# Patient Record
Sex: Male | Born: 2000
Health system: Southern US, Community
[De-identification: ages and names within clinical notes are randomized; demographics above are authoritative.]

## PROBLEM LIST (undated history)

## (undated) DIAGNOSIS — J45909 Unspecified asthma, uncomplicated: Secondary | ICD-10-CM

---
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
## 2021-05-06 DIAGNOSIS — Z23 Encounter for immunization: Secondary | ICD-10-CM | POA: Diagnosis not present

## 2021-07-23 DIAGNOSIS — S93402A Sprain of unspecified ligament of left ankle, initial encounter: Secondary | ICD-10-CM | POA: Diagnosis not present

## 2022-02-03 ENCOUNTER — Other Ambulatory Visit (HOSPITAL_BASED_OUTPATIENT_CLINIC_OR_DEPARTMENT_OTHER): Payer: Self-pay

## 2022-02-03 ENCOUNTER — Encounter (HOSPITAL_BASED_OUTPATIENT_CLINIC_OR_DEPARTMENT_OTHER): Payer: Self-pay | Admitting: Urology

## 2022-02-03 ENCOUNTER — Emergency Department (HOSPITAL_BASED_OUTPATIENT_CLINIC_OR_DEPARTMENT_OTHER): Payer: Federal, State, Local not specified - PPO

## 2022-02-03 ENCOUNTER — Emergency Department (HOSPITAL_BASED_OUTPATIENT_CLINIC_OR_DEPARTMENT_OTHER)
Admission: EM | Admit: 2022-02-03 | Discharge: 2022-02-03 | Disposition: A | Discharge status: Home / Self Care | Payer: Federal, State, Local not specified - PPO | Attending: Emergency Medicine | Admitting: Emergency Medicine

## 2022-02-03 DIAGNOSIS — J45909 Unspecified asthma, uncomplicated: Secondary | ICD-10-CM | POA: Diagnosis not present

## 2022-02-03 DIAGNOSIS — Z7951 Long term (current) use of inhaled steroids: Secondary | ICD-10-CM | POA: Diagnosis not present

## 2022-02-03 DIAGNOSIS — R0602 Shortness of breath: Secondary | ICD-10-CM | POA: Diagnosis not present

## 2022-02-03 DIAGNOSIS — R0789 Other chest pain: Secondary | ICD-10-CM | POA: Diagnosis not present

## 2022-02-03 DIAGNOSIS — M940 Chondrocostal junction syndrome [Tietze]: Secondary | ICD-10-CM | POA: Insufficient documentation

## 2022-02-03 DIAGNOSIS — R079 Chest pain, unspecified: Secondary | ICD-10-CM | POA: Diagnosis not present

## 2022-02-03 DIAGNOSIS — R062 Wheezing: Secondary | ICD-10-CM | POA: Diagnosis not present

## 2022-02-03 LAB — BASIC METABOLIC PANEL
Anion gap: 6 (ref 5–15)
BUN: 15 mg/dL (ref 6–20)
CO2: 26 mmol/L (ref 22–32)
Calcium: 8.8 mg/dL — ABNORMAL LOW (ref 8.9–10.3)
Chloride: 105 mmol/L (ref 98–111)
Creatinine, Ser: 0.99 mg/dL (ref 0.61–1.24)
GFR, Estimated: >60 mL/min (ref >60)
Glucose, Bld: 105 mg/dL — ABNORMAL HIGH (ref 70–99)
Potassium: 3.4 mmol/L — ABNORMAL LOW (ref 3.5–5.1)
Sodium: 137 mmol/L (ref 135–145)

## 2022-02-03 LAB — CBC WITH DIFFERENTIAL/PLATELET
Abs Immature Granulocytes: 0.02 10*3/uL (ref 0.00–0.07)
Basophils Absolute: 0.1 10*3/uL (ref 0.0–0.1)
Basophils Relative: 1 %
Eosinophils Absolute: 0.1 10*3/uL (ref 0.0–0.5)
Eosinophils Relative: 1 %
HCT: 48.3 % (ref 39.0–52.0)
Hemoglobin: 16.8 g/dL (ref 13.0–17.0)
Immature Granulocytes: 0 %
Lymphocytes Relative: 44 %
Lymphs Abs: 3.1 10*3/uL (ref 0.7–4.0)
MCH: 31.8 pg (ref 26.0–34.0)
MCHC: 34.8 g/dL (ref 30.0–36.0)
MCV: 91.3 fL (ref 80.0–100.0)
Monocytes Absolute: 0.5 10*3/uL (ref 0.1–1.0)
Monocytes Relative: 7 %
Neutro Abs: 3.3 10*3/uL (ref 1.7–7.7)
Neutrophils Relative %: 47 %
Platelets: 169 10*3/uL (ref 150–400)
RBC: 5.29 MIL/uL (ref 4.22–5.81)
RDW: 11.6 % (ref 11.5–15.5)
WBC: 7 10*3/uL (ref 4.0–10.5)
nRBC: 0 % (ref 0.0–0.2)

## 2022-02-03 LAB — TROPONIN I (HIGH SENSITIVITY): Troponin I (High Sensitivity): <2 ng/L (ref <18)

## 2022-02-03 MED ORDER — ALBUTEROL SULFATE HFA 108 (90 BASE) MCG/ACT IN AERS
1.0000 | INHALATION_SPRAY | Freq: Four times a day (QID) | RESPIRATORY_TRACT | 0 refills | Status: AC | PRN
  Filled 2022-02-03: qty 6.7, 25d supply, fill #0

## 2022-02-03 MED ORDER — IPRATROPIUM-ALBUTEROL 0.5-2.5 (3) MG/3ML IN SOLN
3.0000 mL | Freq: Once | RESPIRATORY_TRACT | Status: AC
  Administered 2022-02-03: 3 mL via RESPIRATORY_TRACT
  Filled 2022-02-03: qty 3

## 2022-02-03 NOTE — ED Notes (Signed)
Respiratory Nicky Notified of pt arrival

## 2022-02-03 NOTE — Discharge Instructions (Addendum)
Your labs today were all very reassuring.  Your heart appears to be fine.  I refilled your albuterol inhaler should you feel short of breath again.  As we discussed, the chest pain that you are experiencing is most consistent with costochondritis.  I attached some information here for you to read, but typically this will resolve after a couple of weeks with NSAID therapy.

## 2022-02-03 NOTE — ED Provider Notes (Signed)
[facility] EMERGENCY DEPARTMENT Provider Note   CSN: [id number] Arrival date & time: 02/03/22  1311     History  Chief Complaint  Patient presents with   Shortness of Breath    Jason Wright is a 21 y.o. male with history of asthma presents to the emergency department for evaluation of chest pain and shortness of breath that started approximately 30 minutes prior to arrival.  Patient states symptoms started while he was at rest and it felt like a dull aching pain center of his chest.  He also felt quite short of breath and he used his albuterol inhaler with not significant improvement.  Since then, symptoms have improved although reports that they are coming and going in waves.  He denies fever, chills, abdominal pain, nausea, vomiting, diarrhea, leg swelling.  No previous history of cardiac disease, no immediate family history of cardiac disease under the age of 97.  Denies recent heavy lifting or bench pressing.   Shortness of Breath Associated symptoms: chest pain   Associated symptoms: no abdominal pain, no fever, no headaches and no vomiting        Home Medications Prior to Admission medications   Medication Sig Start Date End Date Taking? Authorizing Provider  albuterol (VENTOLIN HFA) 108 (90 Base) MCG/ACT inhaler Inhale 1-2 puffs into the lungs every 6 (six) hours as needed for wheezing or shortness of breath. 02/03/22  Yes Raynald Blend R, PA-C  albuterol (VENTOLIN HFA) 108 (90 Base) MCG/ACT inhaler 2 puffs as needed    [provider]  budesonide-formoterol (SYMBICORT) 80-4.5 MCG/ACT inhaler 2 puffs    [provider]  fluticasone (FLONASE) 50 MCG/ACT nasal spray Place 2 sprays into both nostrils daily. 09/04/20   Joy, Shawn C, PA-C  fluticasone (FLONASE) 50 MCG/ACT nasal spray PLACE 2 SPRAYS INTO BOTH NOSTRILS DAILY 09/04/20 09/04/21  Joy, Shawn C, PA-C      Allergies    Penicillins    Review of Systems   Review of Systems  Constitutional:   Negative for fever.  Respiratory:  Positive for shortness of breath.   Cardiovascular:  Positive for chest pain. Negative for leg swelling.  Gastrointestinal:  Negative for abdominal pain, diarrhea, nausea and vomiting.  Genitourinary:  Negative for dysuria.  Neurological:  Negative for numbness and headaches.    Physical Exam Updated Vital Signs BP 116/78   Pulse 94   Temp 98.5 F (36.9 C) (Oral)   Resp 16   Ht 5\' 9"  (1.753 m)   Wt 117.5 kg   SpO2 100%   BMI 38.25 kg/m  Physical Exam Vitals and nursing note reviewed.  Constitutional:      General: He is not in acute distress.    Appearance: He is not ill-appearing.  HENT:     Head: Atraumatic.  Eyes:     Conjunctiva/sclera: Conjunctivae normal.  Cardiovascular:     Rate and Rhythm: Normal rate and regular rhythm.     Pulses: Normal pulses.     Heart sounds: No murmur heard. Pulmonary:     Effort: Pulmonary effort is normal. No respiratory distress.     Breath sounds: Wheezing present.     Comments: Mild expiratory wheezing noted in upper lung fields Chest:       Comments: Reproducible sternal chest wall tenderness.  No crepitus, ecchymosis or palpable deformity. Abdominal:     General: Abdomen is flat. There is no distension.     Palpations: Abdomen is soft.     Tenderness:  There is no abdominal tenderness.  Musculoskeletal:        General: Normal range of motion.     Cervical back: Normal range of motion.  Skin:    General: Skin is warm and dry.     Capillary Refill: Capillary refill takes less than 2 seconds.  Neurological:     General: No focal deficit present.     Mental Status: He is alert.  Psychiatric:        Mood and Affect: Mood normal.     ED Results / Procedures / Treatments   Labs (all labs ordered are listed, but only abnormal results are displayed) Labs Reviewed  BASIC METABOLIC PANEL - Abnormal; Notable for the following components:      Result Value   Potassium 3.4 (*)    Glucose, Bld  105 (*)    Calcium 8.8 (*)    All other components within normal limits  CBC WITH DIFFERENTIAL/PLATELET  TROPONIN I (HIGH SENSITIVITY)    EKG EKG Interpretation  Date/Time:  Wednesday February 03 2022 13:24:03 EDT Ventricular Rate:  80 PR Interval:  135 QRS Duration: 100 QT Interval:  371 QTC Calculation: 428 R Axis:   54 Text Interpretation: Sinus arrhythmia RSR' in V1 or V2, right VCD or RVH Confirmed by Ernie Avena ([id number]) on 02/03/2022 1:26:45 PM  Radiology DG Chest 2 View  Result Date: 02/03/2022 CLINICAL DATA:  Chest pain, shortness of breath EXAM: CHEST - 2 VIEW COMPARISON:  09/04/2020 FINDINGS: The heart size and mediastinal contours are within normal limits. Both lungs are clear. The visualized skeletal structures are unremarkable. IMPRESSION: No active cardiopulmonary disease. Electronically Signed   By: Ernie Avena M.D.   On: 02/03/2022 14:04    Procedures Procedures    Medications Ordered in ED Medications  ipratropium-albuterol (DUONEB) 0.5-2.5 (3) MG/3ML nebulizer solution 3 mL (3 mLs Nebulization Given 02/03/22 1404)    ED Course/ Medical Decision Making/ A&P                           Medical Decision Making Amount and/or Complexity of Data Reviewed Labs: ordered. Radiology: ordered.  Risk Prescription drug management.   Social determinants of health:  Social History   Socioeconomic History   Marital status: Single    Spouse name: Not on file   Number of children: Not on file   Years of education: Not on file   Highest education level: Not on file  Occupational History   Not on file  Tobacco Use   Smoking status: Never   Smokeless tobacco: Never  Substance and Sexual Activity   Alcohol use: Never   Drug use: Never   Sexual activity: Not on file  Other Topics Concern   Not on file  Social History Narrative   Not on file   Social Determinants of Health   Financial Resource Strain: Not on file  Food Insecurity: Not on file   Transportation Needs: Not on file  Physical Activity: Not on file  Stress: Not on file  Social Connections: Not on file  Intimate Partner Violence: Not on file     Initial impression:  This patient presents to the ED for concern of chest pain or shortness of breath, this involves an extensive number of treatment options, and is a complaint that carries with it a high risk of complications and morbidity.   Differentials include ACS, PE, pneumonia, asthma exacerbation, anxiety, costochondritis.   Comorbidities affecting care:  Asthma  Additional history obtained: Mother  Lab Tests  I Ordered, reviewed, and interpreted labs and EKG.  The pertinent results include:  BMP, CBC and troponin normal  Imaging Studies ordered:  I ordered imaging studies including  Chest x-ray normal I independently visualized and interpreted imaging and I agree with the radiologist interpretation.   EKG: NSR  Cardiac Monitoring:  The patient was maintained on a cardiac monitor.  I personally viewed and interpreted the cardiac monitored which showed an underlying rhythm of: nsr   Medicines ordered and prescription drug management:  I ordered medication including: Duoneb    Reevaluation of the patient after these medicines showed that the patient resolved I have reviewed the patients home medicines and have made adjustments as needed    ED Course/Re-evaluation: Presents in no acute distress and is nontoxic-appearing.  He is tachypneic with respiratory rate of 25 upon arrival although he is satting at 100% on room air.  On exam, he has reproducible sternal chest wall tenderness and lungs notable for expiratory wheezing in the upper lung fields.  Notes that when pressing on sternum, this aggravates the same pain that he was experiencing which brought him to the emergency department.  He was given DuoNeb treatment with great improvement in symptoms..  Suspect that based on age and reproducible nature  of chest pain, this is likely a costochondritis but I obtained a single troponin and basic labs out of abundance of caution.  Troponin was normal and labs are overall reassuring.  Will refill patient's albuterol inhaler should he develop wheezing again.  Advised on treatment for costochondritis to include NSAIDs for a couple of weeks.  Follow-up with PCP as needed.  Return precautions were discussed.  Patient and mother expressed understanding are amenable to plan.  Disposition:  After consideration of the diagnostic results, physical exam, history and the patients response to treatment feel that the patent would benefit from discharge.   Costochondritis Wheezing: Plan and management as described above. Discharged home in good condition.  Final Clinical Impression(s) / ED Diagnoses Final diagnoses:  Costochondritis  Wheezing    Rx / DC Orders ED Discharge Orders          Ordered    albuterol (VENTOLIN HFA) 108 (90 Base) MCG/ACT inhaler  Every 6 hours PRN        02/03/22 1550              Janell Quiet, PA-C 02/03/22 1552    Ernie Avena, MD 02/03/22 2055

## 2022-02-03 NOTE — ED Triage Notes (Signed)
SOB and chest pain that started approx 30 min PTA while at rest  Used inhaler with no relief  H/o asthma

## 2022-03-11 DIAGNOSIS — Z0000 Encounter for general adult medical examination without abnormal findings: Secondary | ICD-10-CM | POA: Diagnosis not present

## 2022-03-11 DIAGNOSIS — K760 Fatty (change of) liver, not elsewhere classified: Secondary | ICD-10-CM | POA: Diagnosis not present

## 2022-03-11 DIAGNOSIS — Z13220 Encounter for screening for lipoid disorders: Secondary | ICD-10-CM | POA: Diagnosis not present

## 2022-03-11 DIAGNOSIS — Z23 Encounter for immunization: Secondary | ICD-10-CM | POA: Diagnosis not present

## 2022-04-30 DIAGNOSIS — K921 Melena: Secondary | ICD-10-CM | POA: Diagnosis not present

## 2022-04-30 DIAGNOSIS — R7989 Other specified abnormal findings of blood chemistry: Secondary | ICD-10-CM | POA: Diagnosis not present

## 2022-04-30 DIAGNOSIS — R197 Diarrhea, unspecified: Secondary | ICD-10-CM | POA: Diagnosis not present

## 2022-04-30 DIAGNOSIS — R945 Abnormal results of liver function studies: Secondary | ICD-10-CM | POA: Diagnosis not present

## 2022-05-04 DIAGNOSIS — Z23 Encounter for immunization: Secondary | ICD-10-CM | POA: Diagnosis not present

## 2022-06-03 DIAGNOSIS — K921 Melena: Secondary | ICD-10-CM | POA: Diagnosis not present

## 2022-06-03 DIAGNOSIS — R1084 Generalized abdominal pain: Secondary | ICD-10-CM | POA: Diagnosis not present

## 2022-06-03 DIAGNOSIS — K2980 Duodenitis without bleeding: Secondary | ICD-10-CM | POA: Diagnosis not present

## 2022-06-03 DIAGNOSIS — R197 Diarrhea, unspecified: Secondary | ICD-10-CM | POA: Diagnosis not present

## 2022-06-03 DIAGNOSIS — K2930 Chronic superficial gastritis without bleeding: Secondary | ICD-10-CM | POA: Diagnosis not present

## 2022-08-02 IMAGING — CT CT ABD-PELV W/ CM
1 of 2 series · 13 of 32 positions shown, 18 images · IV contrast (APPLIED)
Comparison: None.

CLINICAL DATA: Abdominal pain

EXAM:
CT ABDOMEN AND PELVIS WITH CONTRAST
TECHNIQUE: Multidetector CT imaging of the abdomen and pelvis was performed
using the standard protocol following bolus administration of
intravenous contrast.
CONTRAST:  100mL UZKR8K-PRR IOPAMIDOL (UZKR8K-PRR) INJECTION 61%

[Series 3: abd/pelvis w/cm · axial · 0.87mm/px · z∈[-720,-235]mm · 13 of 111 slices shown, 18 images]
[im 7/111  soft-tissue]
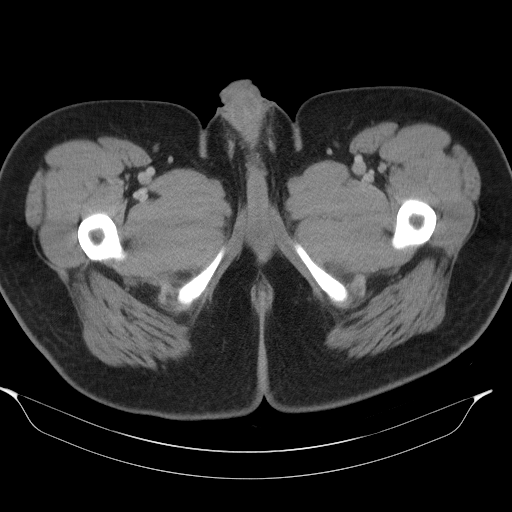
[im 7/111  bone]
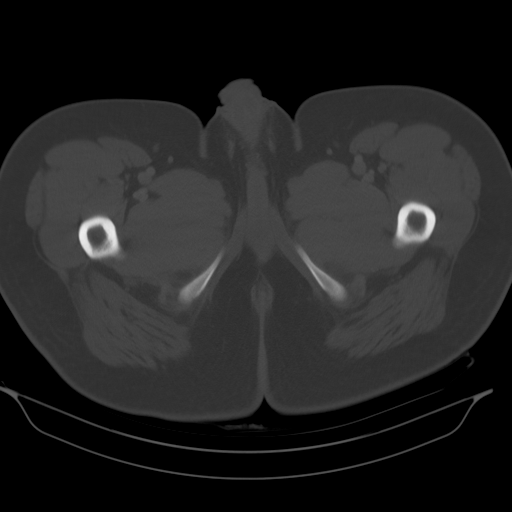
[im 20/111  soft-tissue]
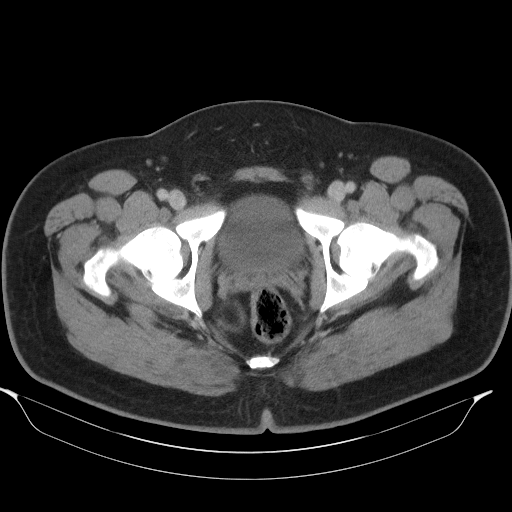
[im 26/111  soft-tissue]
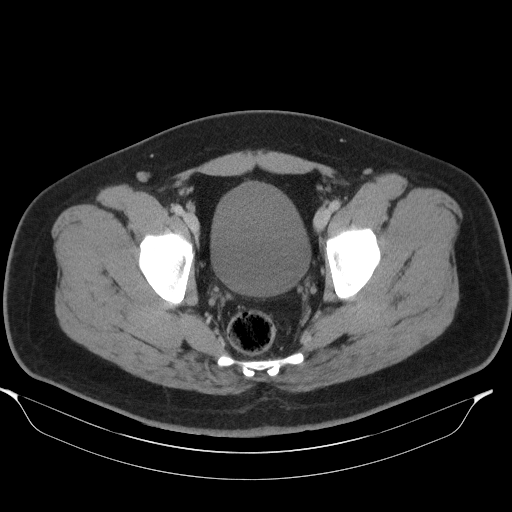
[im 33/111  soft-tissue]
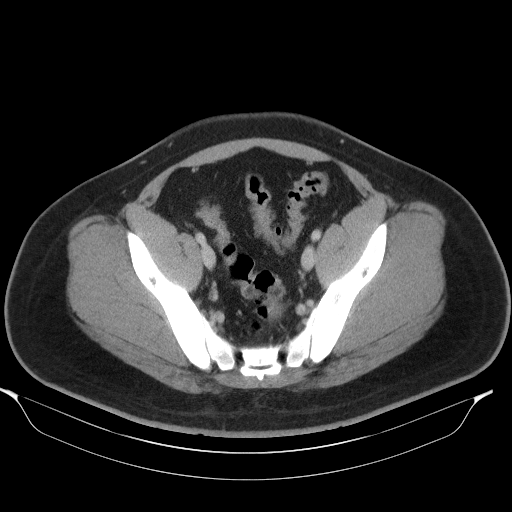
[im 46/111  soft-tissue]
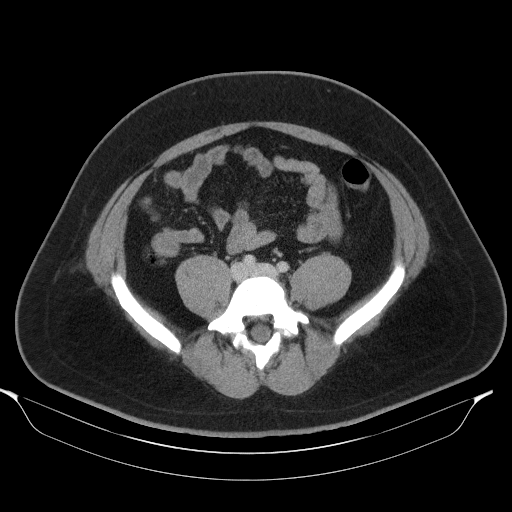
[im 52/111  soft-tissue]
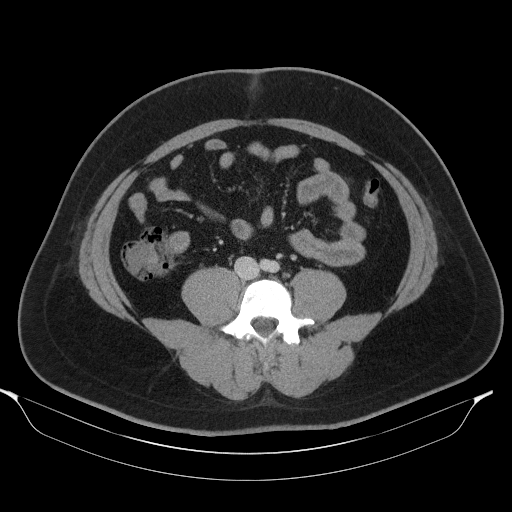
[im 59/111  soft-tissue]
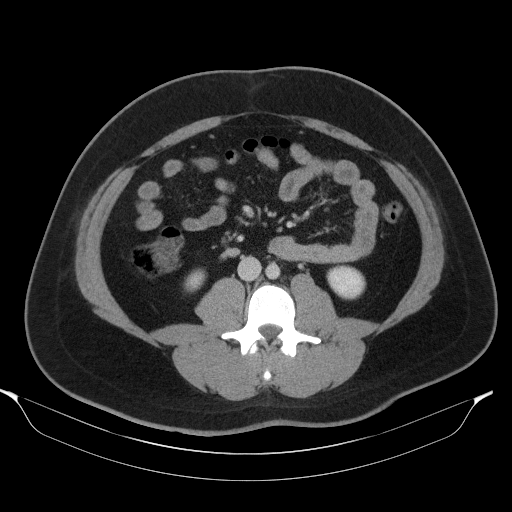
[im 72/111  soft-tissue]
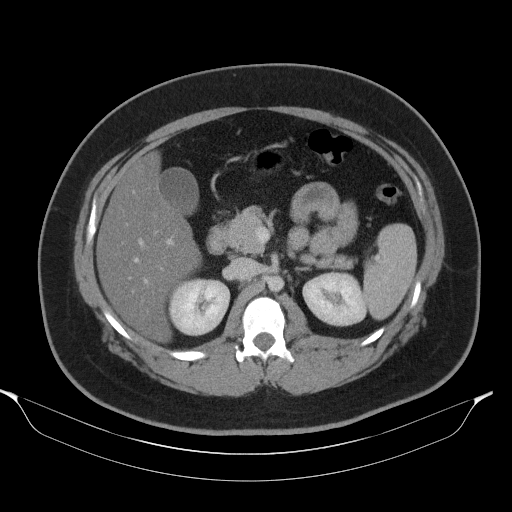
[im 78/111  soft-tissue]
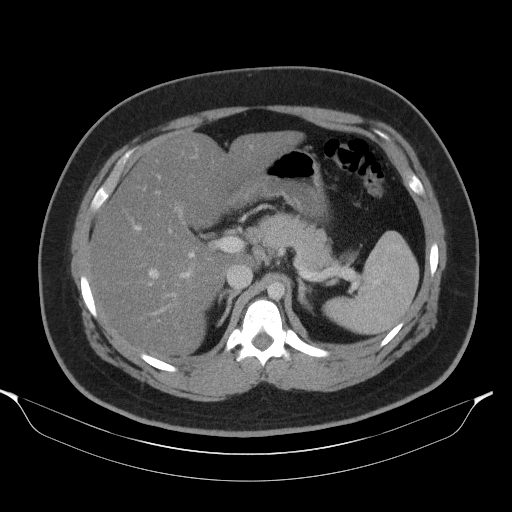
[im 78/111  bone]
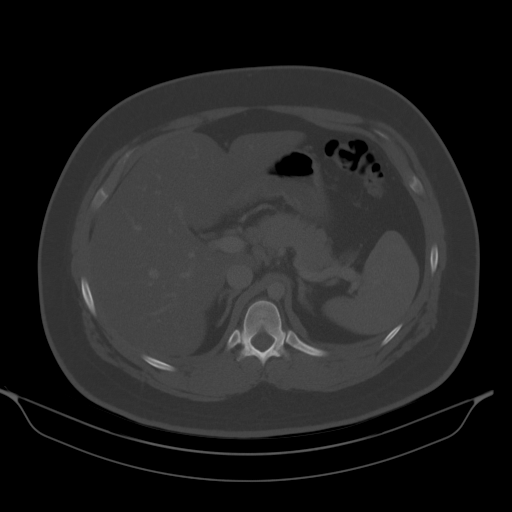
[im 85/111  soft-tissue]
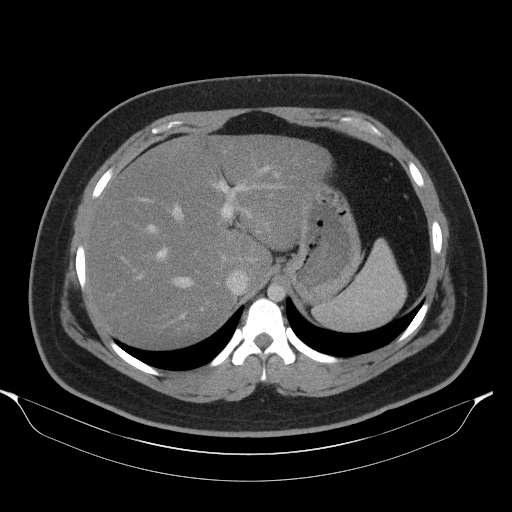
[im 85/111  lung]
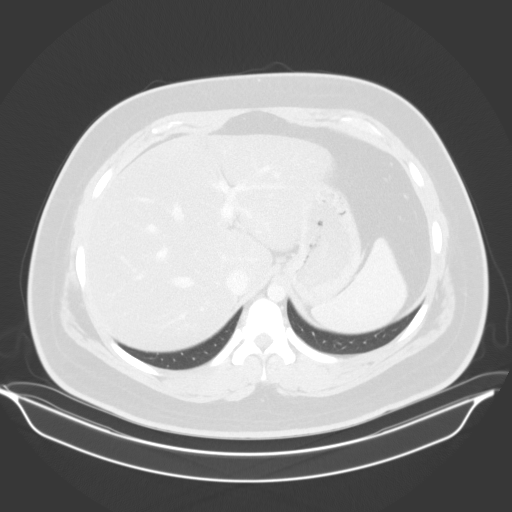
[im 91/111  lung]
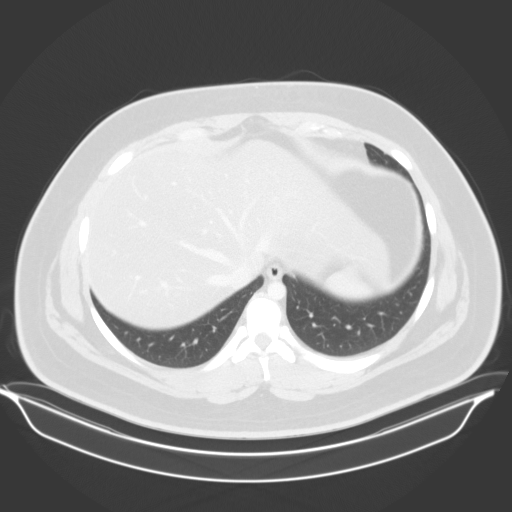
[im 98/111  soft-tissue]
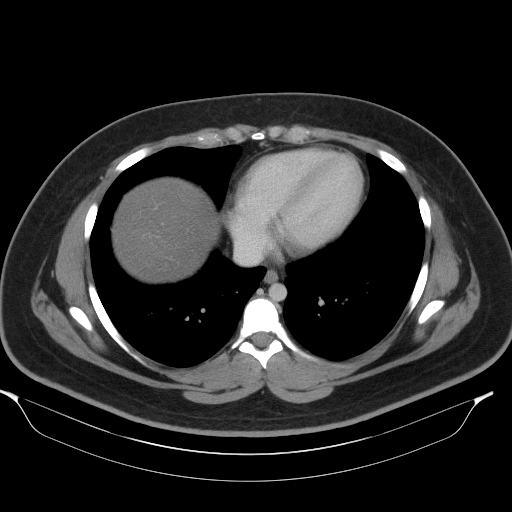
[im 98/111  lung]
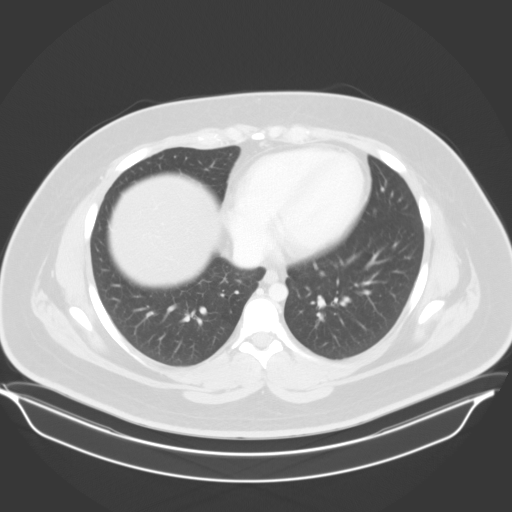
[im 104/111  soft-tissue]
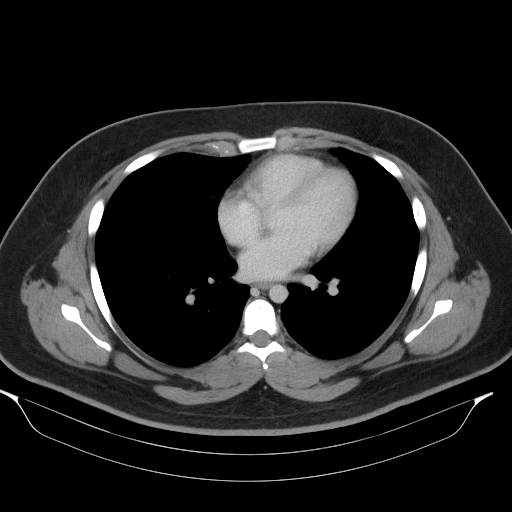
[im 104/111  lung]
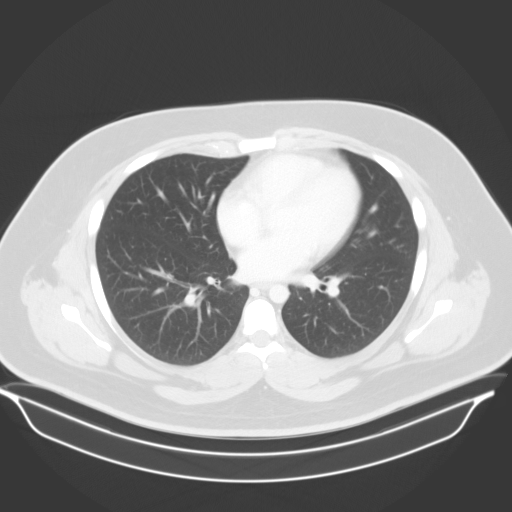

[13 of 32 positions shown; findings below may reference images not displayed]

FINDINGS: Lower chest: Lung bases are clear.

Hepatobiliary: There is diffuse decreased attenuation in the liver
consistent with hepatic steatosis. No focal liver lesions are
evident. Gallbladder wall is not appreciably thickened. There is no
biliary duct dilatation.

Pancreas: No pancreatic mass or inflammatory focus.

Spleen: No splenic lesions are evident.

Adrenals/Urinary Tract: Adrenals bilaterally appear normal. Kidneys
bilaterally show no evident mass or hydronephrosis on either side.
There is no apparent renal or ureteral calculus on either side.
Urinary bladder is midline with wall thickness within normal limits.

Stomach/Bowel: There is no appreciable bowel wall or mesenteric
thickening. The terminal ileum appears normal. There is no
demonstrable bowel obstruction. There is no free air or portal
venous air.

Vascular/Lymphatic: There is no abdominal aortic aneurysm. No
arterial vascular lesions are evident. Major venous structures
appear patent. There is no evident adenopathy in the abdomen or
pelvis by size criteria. There are subcentimeter inguinal and
retroperitoneal lymph nodes which do not meet size criteria for
pathologic significance and are considered nonspecific. Several
subcentimeter mesenteric lymph nodes are noted primarily in the
right abdomen.

Reproductive: Prostate and seminal vesicles appear normal in size
and contour.

Other: Appendix appears normal. No evident abscess or ascites in the
abdomen or pelvis. There is fat in the umbilicus region.

Musculoskeletal: There are no blastic or lytic bone lesions. There
are pars defects at L5 bilaterally with 4 mm of anterolisthesis of
L5 on S1. No intramuscular lesions are evident.
IMPRESSION: 1. There are subcentimeter mesenteric lymph nodes, primarily in the
right lower abdomen region. In the appropriate clinical setting,
lymph nodes of this nature could be indicative of a degree of
mesenteric adenitis. There is no adenopathy by size criteria evident
in the abdomen or pelvis.

2.  Hepatic steatosis.

3. No bowel wall thickening or bowel obstruction. No abscess in the
abdomen or pelvis. Appendix appears normal.

4. No renal or ureteral calculus. No hydronephrosis. Urinary bladder
wall thickness normal.

5. Pars defects at L5 bilaterally with mild spondylolisthesis of L5
on S1.

## 2022-09-25 DIAGNOSIS — R5383 Other fatigue: Secondary | ICD-10-CM | POA: Diagnosis not present

## 2022-09-25 DIAGNOSIS — J029 Acute pharyngitis, unspecified: Secondary | ICD-10-CM | POA: Diagnosis not present

## 2022-09-25 DIAGNOSIS — R110 Nausea: Secondary | ICD-10-CM | POA: Diagnosis not present

## 2022-09-25 DIAGNOSIS — R6883 Chills (without fever): Secondary | ICD-10-CM | POA: Diagnosis not present

## 2022-12-22 IMAGING — CR DG CHEST 2V
2 series · 2 of 2 positions shown · non-contrast
Comparison: None.

CLINICAL DATA: History of pneumonia 2 weeks prior, residual cough
and shortness of breath

EXAM:
CHEST - 2 VIEW

[w chest pa]
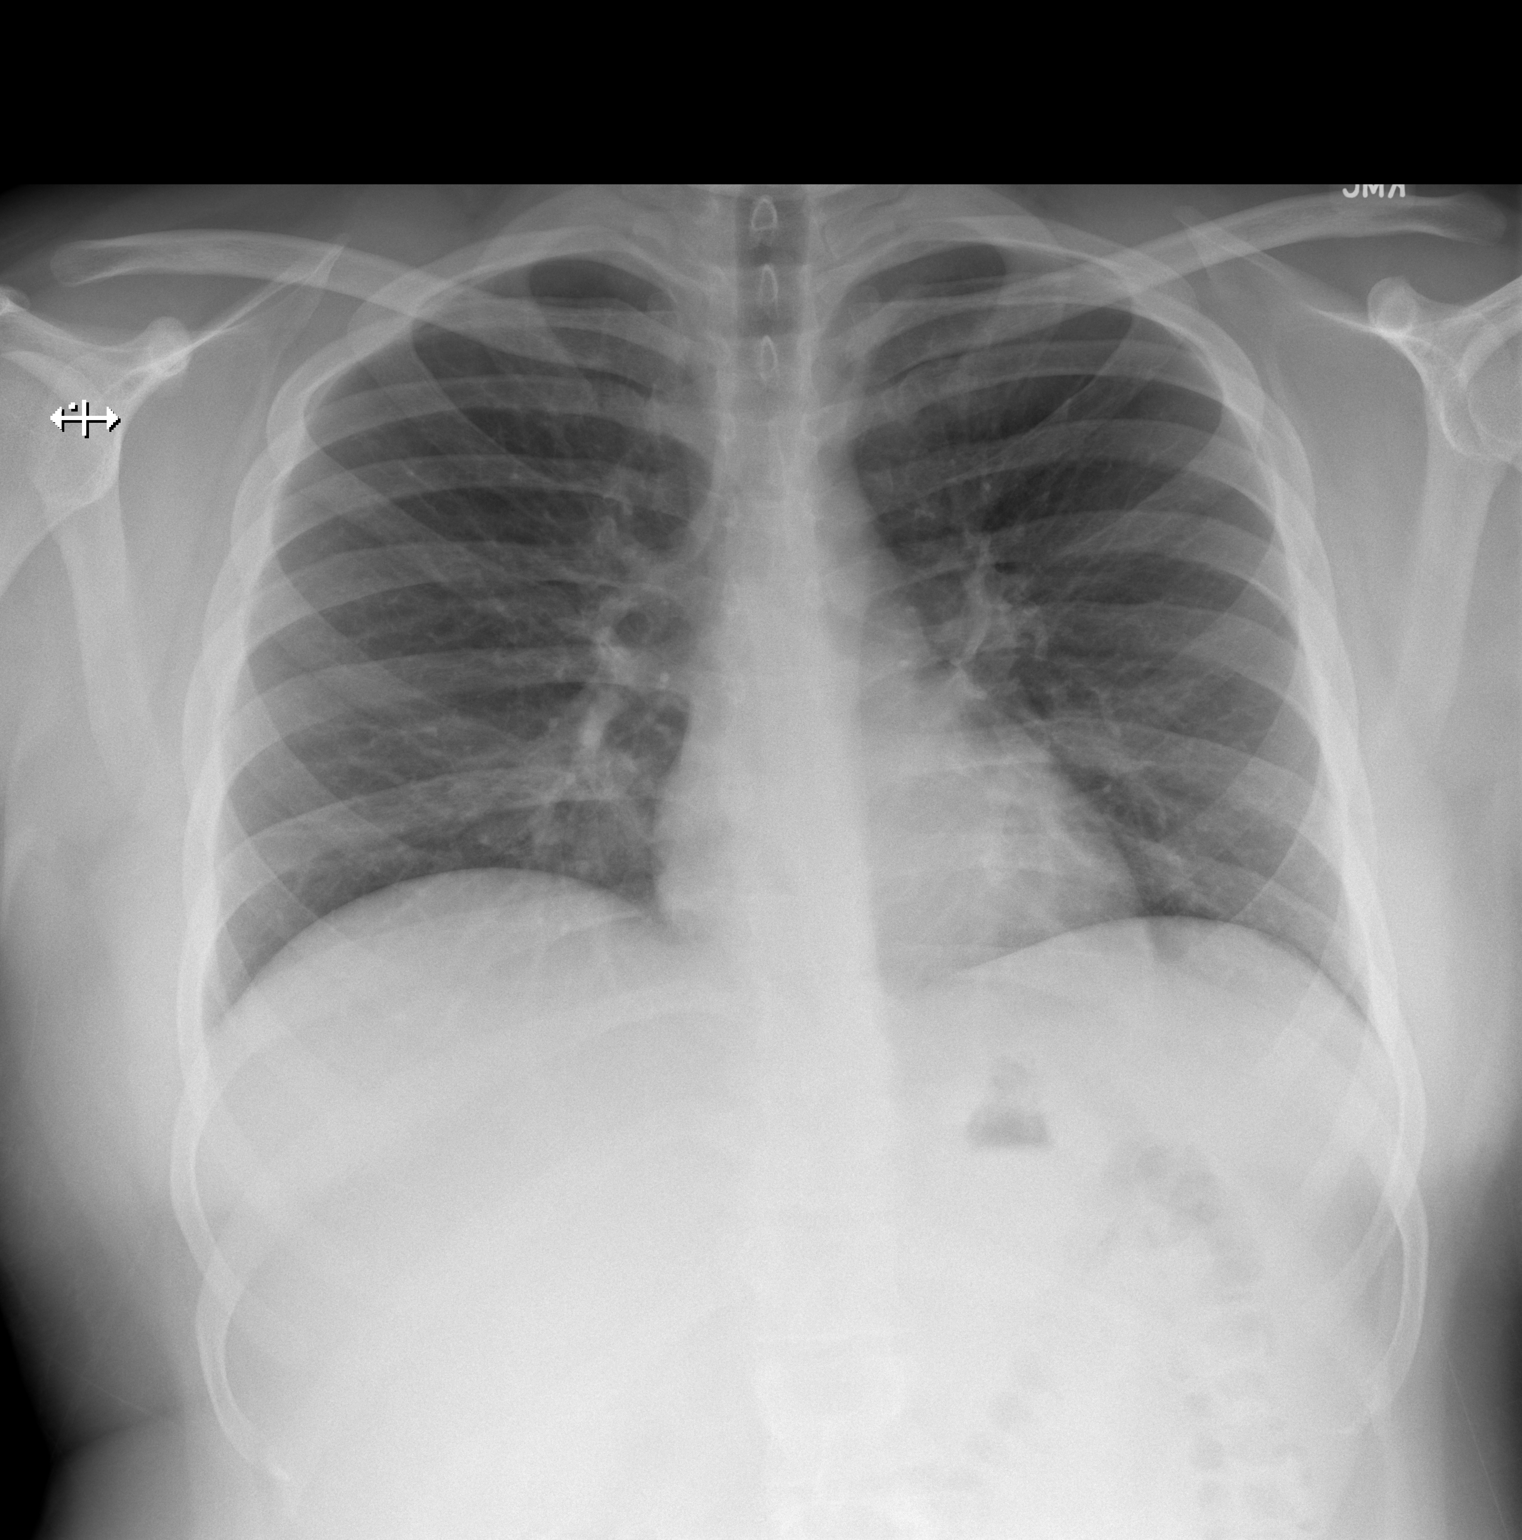

[w chest lat]
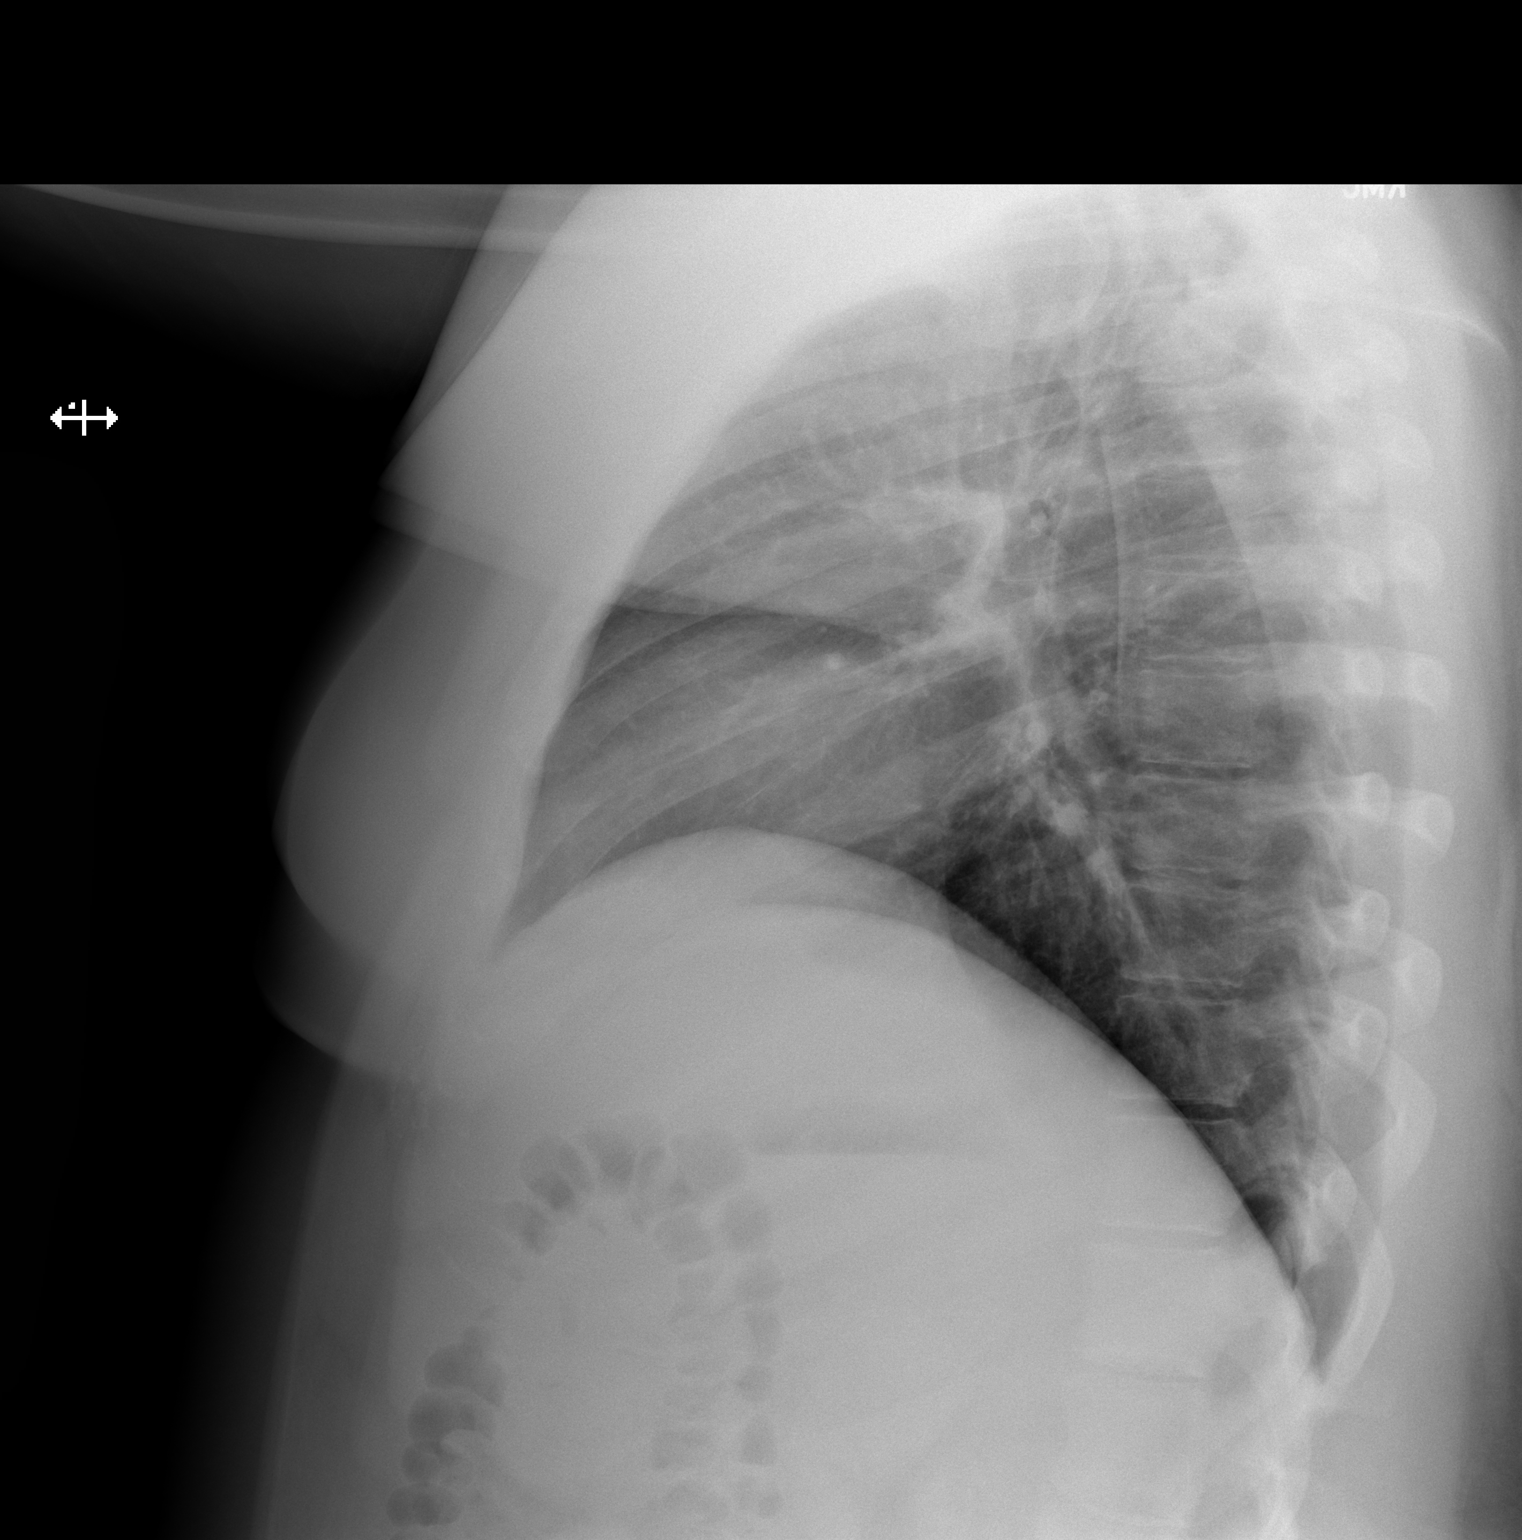

[2 of 2 positions shown; findings below may reference images not displayed]

FINDINGS: Some mild residual airways thickening and faint peribronchial
opacity noted in the infrahilar lungs. Could reflect some residual
infection/airways inflammation. No pneumothorax, effusion or other
significant complication is seen. Convincing features of edema. The
cardiomediastinal contours are unremarkable. No acute osseous or
soft tissue abnormality.
IMPRESSION: Some mild residual airways thickening and faint peribronchial
opacity in the infrahilar lungs, may reflect some residual
infection/airways inflammation.

## 2023-06-13 DIAGNOSIS — Z23 Encounter for immunization: Secondary | ICD-10-CM | POA: Diagnosis not present

## 2023-12-09 ENCOUNTER — Ambulatory Visit
Admission: RE | Admit: 2023-12-09 | Discharge: 2023-12-09 | Disposition: A | Discharge status: Home / Self Care | Source: Ambulatory Visit | Attending: Emergency Medicine | Admitting: Emergency Medicine

## 2023-12-09 VITALS — BP 127/83 | HR 90 | Temp 97.6°F | Resp 16

## 2023-12-09 DIAGNOSIS — H60391 Other infective otitis externa, right ear: Secondary | ICD-10-CM

## 2023-12-09 DIAGNOSIS — H6123 Impacted cerumen, bilateral: Secondary | ICD-10-CM | POA: Diagnosis not present

## 2023-12-09 MED ORDER — OFLOXACIN 0.3 % OT SOLN: 10.0000 [drp] | Freq: Every day | OTIC | 0 refills | Status: AC

## 2023-12-09 MED ORDER — CARBAMIDE PEROXIDE 6.5 % OT SOLN: 5.0000 [drp] | Freq: Two times a day (BID) | OTIC | 0 refills | Status: AC

## 2023-12-09 NOTE — ED Triage Notes (Signed)
 Pt states right ear pain that radiates to his jaw for the past week. States he took Tylenol for the pain.

## 2023-12-09 NOTE — Discharge Instructions (Addendum)
 We cleaned out both of your ears.  Your right external ear canal appears infected.  Use the antibiotic eardrops daily for the next 7 days.  You can alternate between 800 mg of ibuprofen and 500 mg of Tylenol every 4-6 hours to help with pain.  Symptoms should improve over the next few days.  If no improvement or any changes return to clinic for reevaluation.

## 2023-12-09 NOTE — ED Provider Notes (Signed)
 Jason Wright    CSN: [id number] Arrival date & time: 12/09/23  1135      History   Chief Complaint Chief Complaint  Patient presents with   Ear Fullness    Ear and jaw pain - Entered by patient    HPI Jason Wright is a 23 y.o. male.   Patient presents to clinic over concern of right ear pain and diminished hearing. Symptoms have been worsening over the past week. Has not had drainage from the ear or fevers. Has tried to clean out the ear more with water in the shower. Taking tylenol for pain.   The history is provided by the patient and medical records.  Ear Fullness    Past Medical History:  Diagnosis Date   Asthma     There are no active problems to display for this patient.   History reviewed. No pertinent surgical history.     Home Medications    Prior to Admission medications   Medication Sig Start Date End Date Taking? Authorizing Provider  carbamide peroxide (DEBROX) 6.5 % OTIC solution Place 5 drops into both ears 2 (two) times daily. 12/09/23  Yes Georgi Navarrete  N, FNP  ofloxacin (FLOXIN) 0.3 % OTIC solution Place 10 drops into the right ear daily for 7 days. 12/09/23 12/16/23 Yes Khaleesi Gruel  N, FNP  albuterol  (VENTOLIN  HFA) 108 (90 Base) MCG/ACT inhaler 2 puffs as needed    [provider]  albuterol  (VENTOLIN  HFA) 108 (90 Base) MCG/ACT inhaler Inhale 1-2 puffs into the lungs every 6 (six) hours as needed for wheezing or shortness of breath. 02/03/22   Conklin, Erica R, PA-C  budesonide-formoterol (SYMBICORT) 80-4.5 MCG/ACT inhaler 2 puffs    [provider]  fluticasone  (FLONASE ) 50 MCG/ACT nasal spray Place 2 sprays into both nostrils daily. 09/04/20   Joy, Shawn C, PA-C  fluticasone  (FLONASE ) 50 MCG/ACT nasal spray PLACE 2 SPRAYS INTO BOTH NOSTRILS DAILY 09/04/20 09/04/21  Johnella Naas, PA-C    Family History History reviewed. No pertinent family history.  Social History Social History   Tobacco Use   Smoking status:  Never   Smokeless tobacco: Never  Substance Use Topics   Alcohol use: Never   Drug use: Never     Allergies   Penicillins   Review of Systems Review of Systems  Per HPI  Physical Exam Triage Vital Signs ED Triage Vitals [12/09/23 1145]  Encounter Vitals Group     BP 127/83     Systolic BP Percentile      Diastolic BP Percentile      Pulse Rate 90     Resp 16     Temp 97.6 F (36.4 C)     Temp Source Oral     SpO2 96 %     Weight      Height      Head Circumference      Peak Flow      Pain Score 4     Pain Loc      Pain Education      Exclude from Growth Chart    No data found.  Updated Vital Signs BP 127/83 (BP Location: Right Arm)   Pulse 90   Temp 97.6 F (36.4 C) (Oral)   Resp 16   SpO2 96%   Visual Acuity Right Eye Distance:   Left Eye Distance:   Bilateral Distance:    Right Eye Near:   Left Eye Near:    Bilateral Near:  Physical Exam Vitals and nursing note reviewed.  Constitutional:      Appearance: Normal appearance.  HENT:     Head: Normocephalic and atraumatic.     Right Ear: There is impacted cerumen.     Left Ear: There is impacted cerumen.     Nose: Nose normal.     Mouth/Throat:     Mouth: Mucous membranes are moist.  Eyes:     Conjunctiva/sclera: Conjunctivae normal.  Cardiovascular:     Rate and Rhythm: Normal rate.  Pulmonary:     Effort: Pulmonary effort is normal. No respiratory distress.  Musculoskeletal:        General: Normal range of motion.  Neurological:     General: No focal deficit present.     Mental Status: He is alert.  Psychiatric:        Mood and Affect: Mood normal.      Wright Treatments / Results  Labs (all labs ordered are listed, but only abnormal results are displayed) Labs Reviewed - No data to display  EKG   Radiology No results found.  Procedures Procedures (including critical care time)  Medications Ordered in Wright Medications - No data to display  Initial Impression /  Assessment and Plan / Wright Course  I have reviewed the triage vital signs and the nursing notes.  Pertinent labs & imaging results that were available during my care of the patient were reviewed by me and considered in my medical decision making (see chart for details).  Vitals and triage reviewed, patient is hemodynamically stable.  Bilateral impacted cerumen.  Staff performed ear irrigation, hearing improved afterward and left-sided tympanic membrane is pearly gray.  Right external auditory canal is erythematous and swollen.  Will cover for otitis externa with Ofloxacin.  Symptomatic management for wax buildup discussed.     Final Clinical Impressions(s) / Wright Diagnoses   Final diagnoses:  Bilateral impacted cerumen  Infective otitis externa of right ear     Discharge Instructions      We cleaned out both of your ears.  Your right external ear canal appears infected.  Use the antibiotic eardrops daily for the next 7 days.  You can alternate between 800 mg of ibuprofen and 500 mg of Tylenol every 4-6 hours to help with pain.  Symptoms should improve over the next few days.  If no improvement or any changes return to clinic for reevaluation.   ED Prescriptions     Medication Sig Dispense Auth. Provider   ofloxacin (FLOXIN) 0.3 % OTIC solution Place 10 drops into the right ear daily for 7 days. 5 mL Harlow Lighter, Asli Tokarski  N, FNP   carbamide peroxide (DEBROX) 6.5 % OTIC solution Place 5 drops into both ears 2 (two) times daily. 15 mL Harlow Lighter, Karie Skowron  N, FNP      PDMP not reviewed this encounter.   Mignon Alberts, FNP 12/09/23 1235

## 2024-05-02 DIAGNOSIS — Z23 Encounter for immunization: Secondary | ICD-10-CM | POA: Diagnosis not present

## 2024-05-07 DIAGNOSIS — M9903 Segmental and somatic dysfunction of lumbar region: Secondary | ICD-10-CM | POA: Diagnosis not present

## 2024-05-07 DIAGNOSIS — M5382 Other specified dorsopathies, cervical region: Secondary | ICD-10-CM | POA: Diagnosis not present

## 2024-05-07 DIAGNOSIS — M4316 Spondylolisthesis, lumbar region: Secondary | ICD-10-CM | POA: Diagnosis not present

## 2024-05-07 DIAGNOSIS — M9901 Segmental and somatic dysfunction of cervical region: Secondary | ICD-10-CM | POA: Diagnosis not present
